# Patient Record
Sex: Female | Born: 2000 | Race: White | Hispanic: No | Marital: Single | Smoking: Never smoker
Health system: Southern US, Community
[De-identification: ages and names within clinical notes are randomized; demographics above are authoritative.]

---
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
## 2021-01-31 DIAGNOSIS — Z131 Encounter for screening for diabetes mellitus: Secondary | ICD-10-CM | POA: Diagnosis not present

## 2021-01-31 DIAGNOSIS — Z0000 Encounter for general adult medical examination without abnormal findings: Secondary | ICD-10-CM | POA: Diagnosis not present

## 2021-06-20 DIAGNOSIS — H5213 Myopia, bilateral: Secondary | ICD-10-CM | POA: Diagnosis not present

## 2022-02-06 DIAGNOSIS — Z0000 Encounter for general adult medical examination without abnormal findings: Secondary | ICD-10-CM | POA: Diagnosis not present

## 2022-05-07 DIAGNOSIS — H16011 Central corneal ulcer, right eye: Secondary | ICD-10-CM | POA: Diagnosis not present

## 2022-05-08 DIAGNOSIS — H16011 Central corneal ulcer, right eye: Secondary | ICD-10-CM | POA: Diagnosis not present

## 2022-05-13 DIAGNOSIS — H16011 Central corneal ulcer, right eye: Secondary | ICD-10-CM | POA: Diagnosis not present
